# Patient Record
Sex: Female | Born: 1950 | Race: Black or African American | Hispanic: No | Marital: Married | Smoking: Never smoker
Health system: Southern US, Community
[De-identification: ages and names within clinical notes are randomized; demographics above are authoritative.]

## PROBLEM LIST (undated history)

## (undated) DIAGNOSIS — I10 Essential (primary) hypertension: Secondary | ICD-10-CM

## (undated) DIAGNOSIS — I4891 Unspecified atrial fibrillation: Secondary | ICD-10-CM

## (undated) HISTORY — DX: Unspecified atrial fibrillation: I48.91

## (undated) HISTORY — DX: Essential (primary) hypertension: I10

---

## 2009-12-04 ENCOUNTER — Encounter: Admission: RE | Admit: 2009-12-04 | Discharge: 2009-12-04 | Payer: Self-pay | Admitting: Orthopedic Surgery

## 2010-06-05 IMAGING — CR DG ANKLE 2V *L*
2 series · 2 of 2 positions shown · non-contrast
Comparison: None.

CLINICAL DATA: Ankle pain.

LEFT ANKLE - 2 VIEW

[view not recorded (1 of 2)]
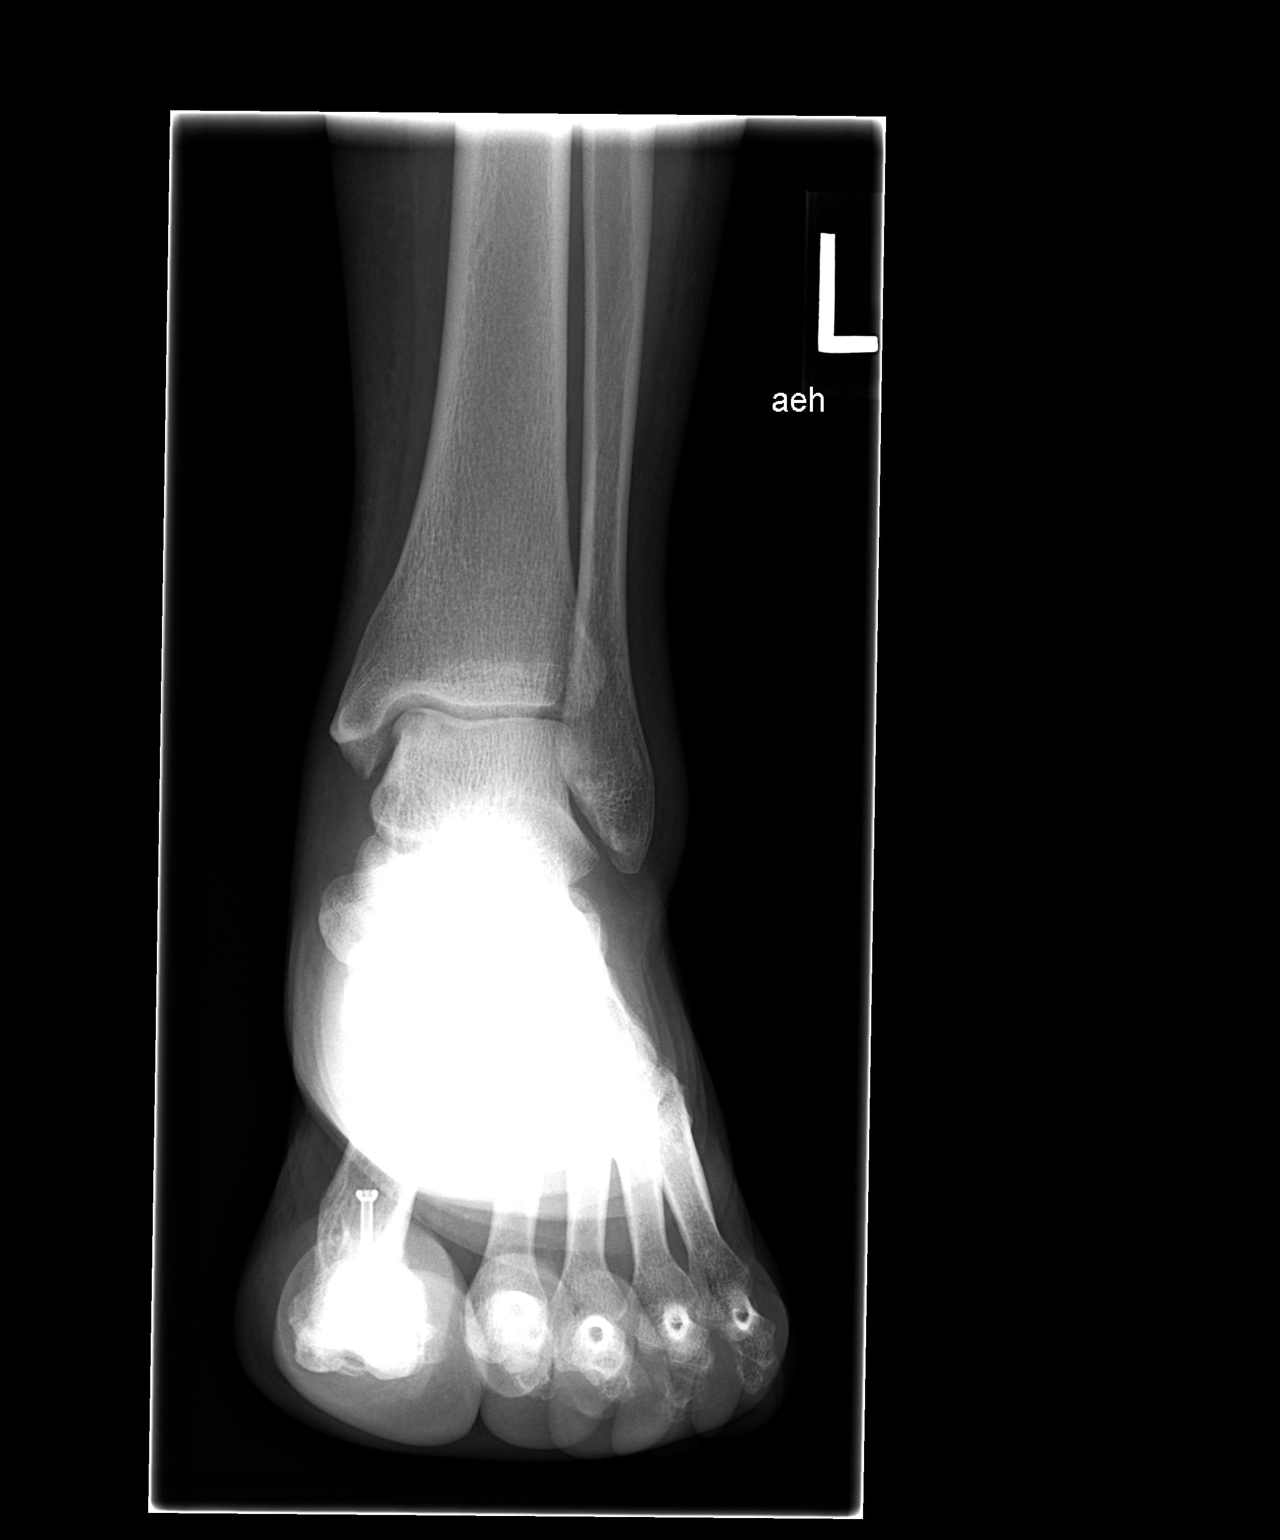

[view not recorded (2 of 2)]
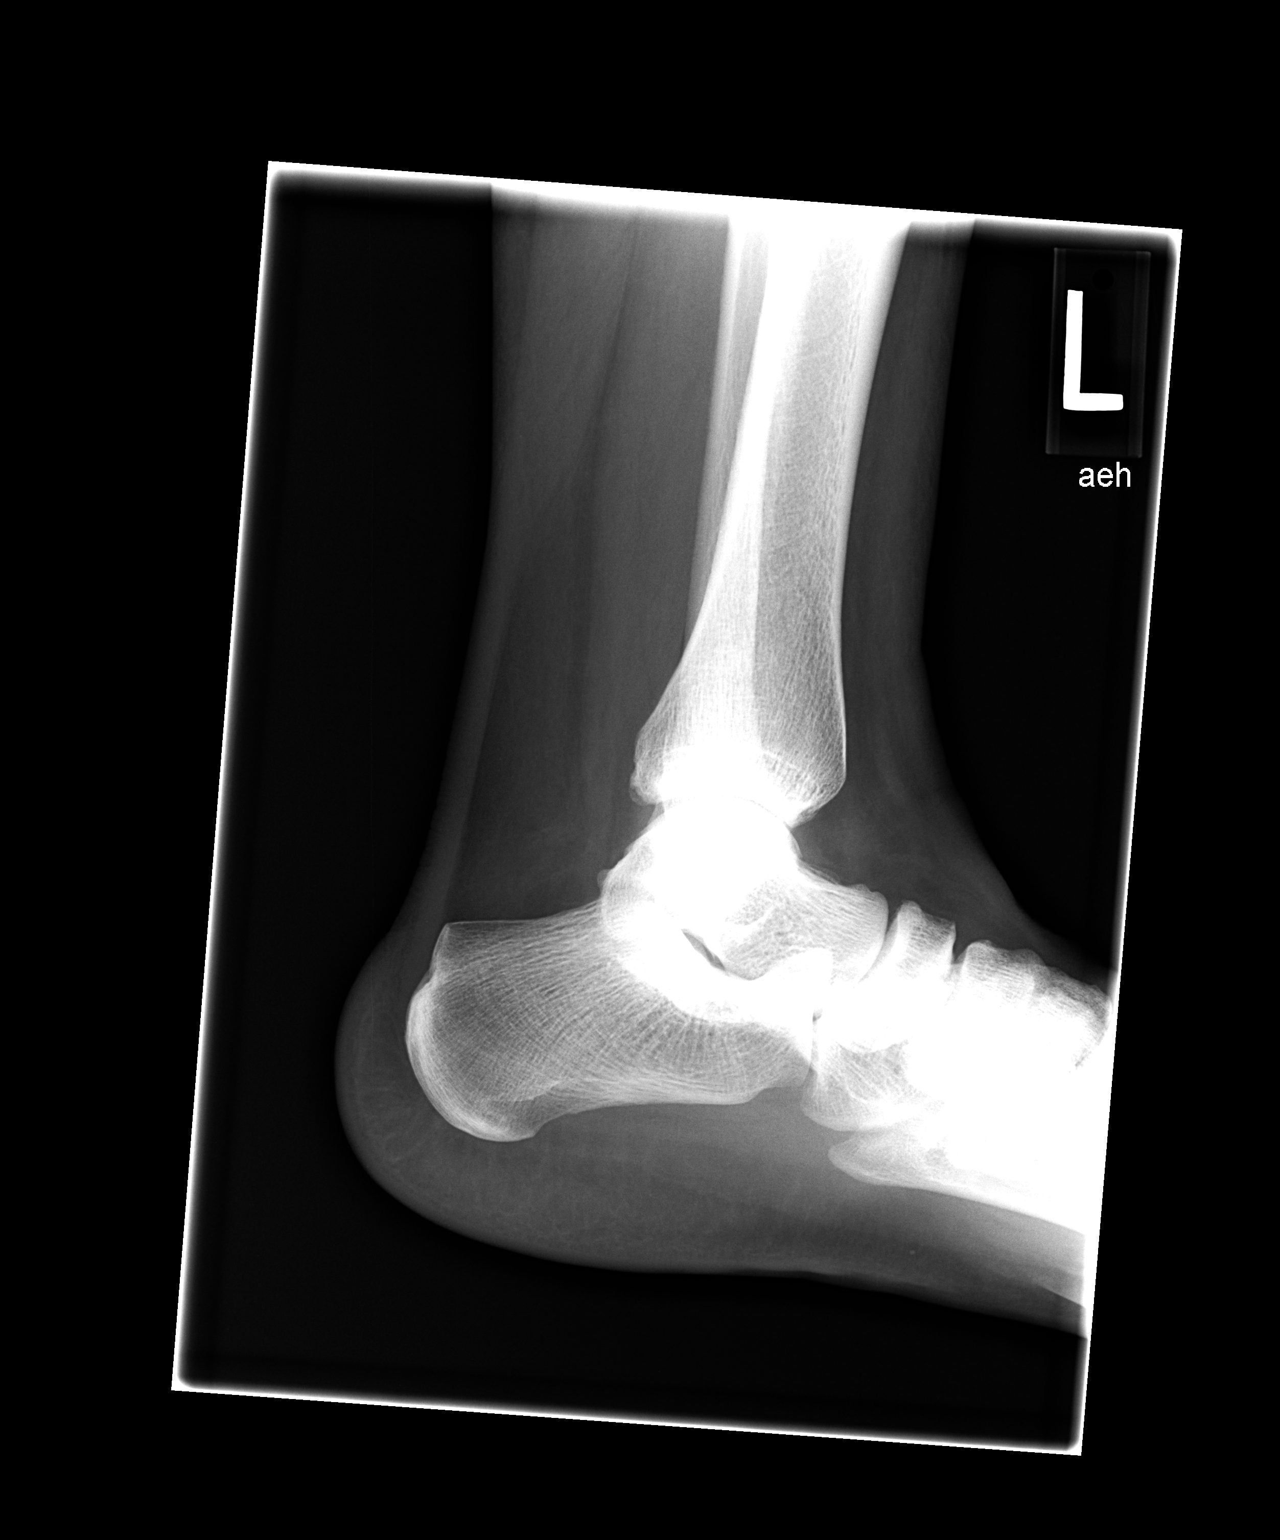

[2 of 2 positions shown; findings below may reference images not displayed]

FINDINGS: Mild soft tissue swelling is seen about the ankle joint.
No underlying fracture.
IMPRESSION: Mild soft tissue swelling without fracture.

## 2023-01-31 ENCOUNTER — Ambulatory Visit: Payer: Medicare (Managed Care) | Admitting: Dermatology

## 2023-01-31 ENCOUNTER — Encounter: Payer: Self-pay | Admitting: Dermatology

## 2023-01-31 VITALS — BP 131/84

## 2023-01-31 DIAGNOSIS — L668 Other cicatricial alopecia: Secondary | ICD-10-CM | POA: Diagnosis not present

## 2023-01-31 DIAGNOSIS — L669 Cicatricial alopecia, unspecified: Secondary | ICD-10-CM

## 2023-01-31 MED ORDER — SAFETY SEAL MISCELLANEOUS MISC: 1.0000 | Freq: Every day | 3 refills | Status: DC

## 2023-01-31 MED ORDER — TRIAMCINOLONE ACETONIDE 10 MG/ML IJ SUSP
2.0000 mg | Freq: Once | INTRAMUSCULAR | Status: AC
  Administered 2023-01-31: 2 mg via INTRADERMAL

## 2023-01-31 NOTE — Patient Instructions (Addendum)
Thank you for visiting my office today. 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  Here are the key treatment instructions and recommendations we discussed:  - Topical Treatment: Prescription for a compounded medication from [facility], containing clobetasol and minoxidil, to control inflammation and stimulate hair growth. Apply the drops in the morning to avoid unwanted hair growth on other areas of your skin.  - Kenalog Injections: We performed kenalog injections today  - Oral Supplements:   - Viviscal: Two tablets daily.   - Vital Proteins: As a collagen supplement to support hair strength and growth.  - Hair Care:   - Continue using gentle hair care products      - Hair dye is acceptable, preferably using natural ingredient brands like Aveda, and avoiding extreme bleaching.  - Lifestyle: Maintain a healthy scalp through regular exfoliation and protect your skin from excessive sun exposure.  - Follow-Up Appointment: Scheduled in four months to assess progress and adjust the treatment plan as needed. We will also review the effectiveness of the topical treatment and consider injections if you experience significant discomfort.  Please start the recommended treatments and supplements as prescribed, and do not hesitate to contact my office if you have any questions or concerns in the meantime.              Due to recent changes in healthcare laws, you may see results of your pathology and/or laboratory studies on MyChart before the doctors have had a chance to review them. We understand that in some cases there may be results that are confusing or concerning to you. Please understand that not all results are received at the same time and often the doctors may need to interpret multiple results in order to provide you with the best plan of care  or course of treatment. Therefore, we ask that you please give [place] 2 business days to thoroughly review all your results before contacting the office for clarification. Should we see a critical lab result, you will be contacted sooner.   If You Need Anything After Your Visit  If you have any questions or concerns for your doctor, please call our main line at [phone] If no one answers, please leave a voicemail as directed and we will return your call as soon as possible. Messages left after 4 pm will be answered the following business day.   You may also send [place] a message via MyChart. We typically respond to MyChart messages within 1-2 business days.  For prescription refills, please ask your pharmacy to contact our office. Our fax number is [phone].  If you have an urgent issue when the clinic is closed that cannot wait until the next business day, you can page your doctor at the number below.    Please note that while we do our best to be available for urgent issues outside of office hours, we are not available 24/7.   If you have an urgent issue and are unable to reach [place], you may choose to seek medical care at your doctor's office, retail clinic, urgent care center, or emergency room.  If you have a medical emergency, please immediately call 911 or go to the emergency department. In the event of inclement weather, please call our main line at [phone] for an update on the status of any delays or closures.  Dermatology Medication Tips: Please keep the  boxes that topical medications come in in order to help keep track of the instructions about where and how to use these. Pharmacies typically print the medication instructions only on the boxes and not directly on the medication tubes.   If your medication is too expensive, please contact our office at [phone] or send [place] a message through MyChart.   We are unable to tell what your co-pay for medications will be in advance as  this is different depending on your insurance coverage. However, we may be able to find a substitute medication at lower cost or fill out paperwork to get insurance to cover a needed medication.   If a prior authorization is required to get your medication covered by your insurance company, please allow [place] 1-2 business days to complete this process.  Drug prices often vary depending on where the prescription is filled and some pharmacies may offer cheaper prices.  The website www.goodrx.com contains coupons for medications through different pharmacies. The prices here do not account for what the cost may be with help from insurance (it may be cheaper with your insurance), but the website can give you the price if you did not use any insurance.  - You can print the associated coupon and take it with your prescription to the pharmacy.  - You may also stop by our office during regular business hours and pick up a GoodRx coupon card.  - If you need your prescription sent electronically to a different pharmacy, notify our office through [facility] or by phone at [phone]

## 2023-01-31 NOTE — Progress Notes (Signed)
   New Patient Visit   Subjective  Shelby Lawson is a 72 y.o. female who presents for the following: Hair loss on the scalp x 20 years. She is concerned of wide parts and general thinning. It has been natural for 25 years. Her father has a history of alopecia at 32 y/o. She has tried Rogaine for Women x 3 months. She is not sure if she used it long enough. She tried ILK injections by a Dermatologist in [place] 20 years ago which helped.She occasionally gets itching and flaking. Current treatment with Bask and Lather scalp Stimulator, shampoo and conditioner x 2 months. No burning or tenderness. Prior history of chemical relaxers, heating appliances, and permanents.    The following portions of the chart were reviewed this encounter and updated as appropriate: medications, allergies, medical history  Review of Systems:  No other skin or systemic complaints except as noted in HPI or Assessment and Plan.  Objective  Well appearing patient in no apparent distress; mood and affect are within normal limits.              A focused examination was performed of the following areas: Scalp  Relevant exam findings are noted in the Assessment and Plan.  vertex scalp Areas of hair loss with loss of follicular ostia.     Assessment & Plan   CCCA Exam: thinning and hair loss at central and frontal scalp  Central Centrifugal Cicatricial Alopecia (CCCA) is a chronic/progressive and irreversible patterned form of scarring alopecia that most commonly affects middle-aged women of African descent that presents with progressive hair loss, starting as a single patch at the vertex of the scalp and then expanding in a centrifugal and symmetrical pattern on the crown. Triggering or aggravation of the disease may occur following traumatic hair care practices, such as cornrows and braiding, extensions, weaves with sewn-in or glued-on hair, use of hot combs, and frequent use of hair relaxers. These  practices should be discontinued to slow progression of disease. Therapies may stop or slow progression but generally do not lead to hair regrowth in scarred areas.   Treatment Plan: Discussed using Rogaine, ILK injections, and compounded topicals. compounded AA gel every morning Viviscal supplements daily Vital proteins supplement  Procedure Note: Cicatricial alopecia vertex scalp  Intralesional injection - vertex scalp Procedure Note Intralesional Injection  Location: vertex of scalp  Informed Consent: Discussed risks (infection, pain, bleeding, bruising, thinning of the skin, loss of skin pigment, lack of resolution, and recurrence of lesion) and benefits of the procedure, as well as the alternatives. Informed consent was obtained. Preparation: The area was prepared a standard fashion.  Anesthesia:none  Procedure Details: An intralesional injection was performed with Kenalog 2 mg/cc. 2.5 cc in total were injected. NDC #: 1610-9604-54 Exp: 06/2024  Total number of injections: > 10  Plan: The patient was instructed on post-op care. Recommend OTC analgesia as needed for pain.   Safety Seal Miscellaneous MISC - vertex scalp 1 Application by Does not apply route daily. Medication: AA gel  Related Medications triamcinolone acetonide (KENALOG) 10 MG/ML injection 2 mg     Return in about 4 months (around 06/03/2023) for Alopecia Follow Up.  Jaclynn Guarneri, CMA, am acting as scribe for [employer], DO.   Documentation: I have reviewed the above documentation for accuracy and completeness, and I agree with the above.  Langston Reusing, DO

## 2023-05-24 ENCOUNTER — Other Ambulatory Visit: Payer: Self-pay

## 2023-05-24 DIAGNOSIS — L669 Cicatricial alopecia, unspecified: Secondary | ICD-10-CM

## 2023-05-24 MED ORDER — SAFETY SEAL MISCELLANEOUS MISC: 1.0000 | Freq: Every day | 4 refills | Status: DC

## 2023-06-06 ENCOUNTER — Ambulatory Visit: Payer: Medicare (Managed Care) | Admitting: Dermatology

## 2023-06-06 ENCOUNTER — Encounter: Payer: Self-pay | Admitting: Dermatology

## 2023-06-06 DIAGNOSIS — L6681 Central centrifugal cicatricial alopecia: Secondary | ICD-10-CM

## 2023-06-06 DIAGNOSIS — L669 Cicatricial alopecia, unspecified: Secondary | ICD-10-CM

## 2023-06-06 DIAGNOSIS — L988 Other specified disorders of the skin and subcutaneous tissue: Secondary | ICD-10-CM

## 2023-06-06 MED ORDER — TRIAMCINOLONE ACETONIDE 10 MG/ML IJ SUSP: 5.0000 mg | Freq: Once | INTRAMUSCULAR | Status: AC

## 2023-06-06 NOTE — Patient Instructions (Addendum)
Hello Shelby Lawson,  Thank you for visiting [place] today. We are encouraged by your dedication to improving your health and are pleased with the progress you are making. Here is a summary of the key instructions from today's consultation:  - A.A. Gel: Continue using the A.A. gel containing clobetasol and minoxidil as discussed. If preferred, we can prescribe them separately as liquids.  - Injections: We performed kenalog injections today to aid in your treatment.  - Skincare Regimen: Start using La Roche-Posay products for your face, including washes and moisturizers. Samples have been provided to you.  - Retinol Cream: Begin an over-the-counter retinol cream regimen using ROC Retinol every other night. We will consider prescription retinol at your follow-up.  - Caffeine Serum: Explore using The Ordinary's caffeine serum for puffiness, available at [facility] or online.  - Scalp Monitoring: Continue monitoring your scalp for itching and inflammation, and use clobetasol as needed.  - Hair Dyes: Consider using hair dyes from brands like Aveda that do not use PPD to minimize chemical exposure.  - Follow-Up: We will see you back in three months to review your progress and adjust your treatment plan as necessary.  Please follow these instructions carefully and do not hesitate to contact us if you have any questions or concerns.  Warm regards,  Dr. Langston Reusing Dermatology        Important Information  Due to recent changes in healthcare laws, you may see results of your pathology and/or laboratory studies on MyChart before the doctors have had a chance to review them. We understand that in some cases there may be results that are confusing or concerning to you. Please understand that not all results are received at the same time and often the doctors may need to interpret multiple results in order to provide you with the best plan of care or course of treatment. Therefore, we ask that you please  give [place] 2 business days to thoroughly review all your results before contacting the office for clarification. Should we see a critical lab result, you will be contacted sooner.   If You Need Anything After Your Visit  If you have any questions or concerns for your doctor, please call our main line at [phone] If no one answers, please leave a voicemail as directed and we will return your call as soon as possible. Messages left after 4 pm will be answered the following business day.   You may also send [place] a message via MyChart. We typically respond to MyChart messages within 1-2 business days.  For prescription refills, please ask your pharmacy to contact our office. Our fax number is [phone].  If you have an urgent issue when the clinic is closed that cannot wait until the next business day, you can page your doctor at the number below.    Please note that while we do our best to be available for urgent issues outside of office hours, we are not available 24/7.   If you have an urgent issue and are unable to reach [place], you may choose to seek medical care at your doctor's office, retail clinic, urgent care center, or emergency room.  If you have a medical emergency, please immediately call 911 or go to the emergency department. In the event of inclement weather, please call our main line at [phone] for an update on the status of any delays or closures.  Dermatology Medication Tips: Please keep the boxes that topical medications come in in order to help keep  track of the instructions about where and how to use these. Pharmacies typically print the medication instructions only on the boxes and not directly on the medication tubes.   If your medication is too expensive, please contact our office at [phone] or send [place] a message through MyChart.   We are unable to tell what your co-pay for medications will be in advance as this is different depending on your insurance coverage.  However, we may be able to find a substitute medication at lower cost or fill out paperwork to get insurance to cover a needed medication.   If a prior authorization is required to get your medication covered by your insurance company, please allow [place] 1-2 business days to complete this process.  Drug prices often vary depending on where the prescription is filled and some pharmacies may offer cheaper prices.  The website www.goodrx.com contains coupons for medications through different pharmacies. The prices here do not account for what the cost may be with help from insurance (it may be cheaper with your insurance), but the website can give you the price if you did not use any insurance.  - You can print the associated coupon and take it with your prescription to the pharmacy.  - You may also stop by our office during regular business hours and pick up a GoodRx coupon card.  - If you need your prescription sent electronically to a different pharmacy, notify our office through [facility] or by phone at [phone]

## 2023-06-06 NOTE — Progress Notes (Unsigned)
   Follow-Up Visit   Subjective  Shelby Lawson is a 72 y.o. female who presents for the following: CCCA  Patient present today for follow up visit for follow up. Patient was last evaluated on 01/31/23. Pt has been using AA Gel/ Patient reports sxs are better. She has noticed some hair growth and thickness.  Patient denies medication changes.  The following portions of the chart were reviewed this encounter and updated as appropriate: medications, allergies, medical history  Review of Systems:  No other skin or systemic complaints except as noted in HPI or Assessment and Plan.  Objective  Well appearing patient in no apparent distress; mood and affect are within normal limits.   A focused examination was performed of the following areas: hair and scalp   Relevant exam findings are noted in the Assessment and Plan.   vertex scalp Areas of hair loss with loss of follicular ostia.              Assessment & Plan   CCCA Exam: thinning and hair loss at central and frontal scalp with good amount of regrowth  Treatment Plan: - Continue using the AA Gel on affected areas once daily. - Suggested tying Aveda hair color due to it's natural nature. -ontinue current treatment and monitor progress. Re-evaluate in 3 months. Educate patient on potential triggers and advise against the use of chemical relaxers.   ANTI-AGING Exam:  dynamic and static rhytides, puffiness around the eyes  Treatment Plan: - Samples of La Roche Posay  - Recommended trying Roc Retinol & The Ordinary Caffeine Solution (OTC). Pictures provided in AVS.   Cicatricial alopecia vertex scalp  triamcinolone acetonide (KENALOG) 10 MG/ML injection 5 mg - vertex scalp   Related Medications Safety Seal Miscellaneous MISC 1 Application by Does not apply route daily. Medication: AA gel    Return for CCCA.    Documentation: I have reviewed the above documentation for accuracy and completeness, and I agree with  the above.   I, Shirron Marcha Solders, CMA, am acting as scribe for [employer], DO.   Langston Reusing, DO

## 2023-07-18 NOTE — Addendum Note (Signed)
Addended by: Terri Piedra on: 07/18/2023 08:19 AM   Modules accepted: Orders

## 2023-09-05 ENCOUNTER — Encounter: Payer: Self-pay | Admitting: Dermatology

## 2023-09-05 ENCOUNTER — Ambulatory Visit (INDEPENDENT_AMBULATORY_CARE_PROVIDER_SITE_OTHER): Payer: Medicare Other | Admitting: Dermatology

## 2023-09-05 DIAGNOSIS — L680 Hirsutism: Secondary | ICD-10-CM

## 2023-09-05 DIAGNOSIS — L811 Chloasma: Secondary | ICD-10-CM | POA: Diagnosis not present

## 2023-09-05 DIAGNOSIS — L669 Cicatricial alopecia, unspecified: Secondary | ICD-10-CM

## 2023-09-05 DIAGNOSIS — L6681 Central centrifugal cicatricial alopecia: Secondary | ICD-10-CM

## 2023-09-05 MED ORDER — TRIAMCINOLONE ACETONIDE 10 MG/ML IJ SUSP
5.0000 mg | Freq: Once | INTRAMUSCULAR | Status: AC
  Administered 2023-09-05: 5 mg via INTRADERMAL

## 2023-09-05 MED ORDER — SAFETY SEAL MISCELLANEOUS MISC: 2 refills | Status: DC

## 2023-09-05 NOTE — Patient Instructions (Addendum)
 Hello Shelby Lawson,  Thank you for visiting my office today. Your dedication to improving your health is commendable, and it's a pleasure to see the progress in your scalp condition. Here is a summary of the key instructions from today's consultation:  Topical Treatment for Scalp: Continue applying the topical treatment every morning.  Hair Growth Injections: We will administer five more injections to aid in hair growth.  Discontinue La Roche-Posay Mela B3: Due to skin sensitivity, please stop using this product.  New Prescription Cream: A prescription for a lightening cream containing tranexamic acid, vitamin C, and niacinamide has been sent to [facility].   Application: Apply this cream morning and night, and follow up with sunscreen in the morning.  Prevent Treatment Transfer: To prevent topical treatment transfer to your pillow, tie your hair up at night.  Martina Sledge Whiskers Treatment: For gray whiskers, consider additional sessions of electrolysis if previous attempts were not effective.  Melasma Topical: Start using the prescribed topical for melasma, applying it morning and night. Ensure to wear sunscreen daily, even when driving.  Sunscreen Recommendation: I have recommended a specific sunscreen that is effective for brown skin, which you can find on [employer].  Please ensure to apply all topical treatments as directed and protect your skin with sunscreen. We will see you in six months for a follow-up, or sooner if you have any concerns or run out of your prescriptions.  Best regards,  Dr. Louana Roup Dermat[facility]     Recommended Sunscreen

## 2023-09-05 NOTE — Progress Notes (Signed)
   Follow-Up Visit   Subjective  Shelby Lawson is a 73 y.o. female who presents for the following: CCCA  Patient presents today for follow up visit for follow up. Patient was last evaluated on 06/06/2023. Pt has been using AA Gel/ Patient reports sxs are better. She has noticed some hair growth and thickness.  Patient denies medication changes.  The following portions of the chart were reviewed this encounter and updated as appropriate: medications, allergies, medical history  Review of Systems:  No other skin or systemic complaints except as noted in HPI or Assessment and Plan.  Objective  Well appearing patient in no apparent distress; mood and affect are within normal limits.   A focused examination was performed of the following areas: hair and scalp   Relevant exam findings are noted in the Assessment and Plan.                  Assessment & Plan   CCCA Exam: thinning and hair loss at central and frontal scalp with good amount of regrowth  Assessment: Improvement in the patient's scalp, with regrowth in the front part and no spread of hair loss to the peripheries. Some scarred follicles remain on the vertex, and the middle area still presents challenges. The patient has been compliant with the prescribed topical treatment since July.  Procedure Note Intralesional Injection  Location: scalp  Informed Consent: Discussed risks (infection, pain, bleeding, bruising, thinning of the skin, loss of skin pigment, lack of resolution, and recurrence of lesion) and benefits of the procedure, as well as the alternatives. Informed consent was obtained. Preparation: The area was prepared a standard fashion.  Anesthesia: none  Procedure Details: An intralesional injection was performed with Kenalog 5 mg/cc. 1 cc in total were injected. NDC #: 9528-4132-44 Exp: 09/2024  Total number of injections: 15  Plan: The patient was instructed on post-op care. Recommend OTC  analgesia as needed for pain.     Melasma Assessment: Patient experienced skin irritation with La Roche-Posay Mela B3 cream.  Plan: Discontinue La Roche-Posay Mela B3 cream. Prescribe a new compounded lightening cream containing tranexamic acid, vitamin C, and niacinamide. Instruct the patient to apply the new prescription cream morning and night.   Advise the use of sunscreen daily, including when driving. Recommend a specific [place] sunscreen brand, with details provided in the after-visit summary.  Facial Hirsutism Assessment: Patient reports hair growth on the face, possibly due to transfer of scalp topical medication. Previous attempts at electrolysis were unsuccessful.  Plan: Instruct the patient to tie hair up at night to prevent topical medication transfer. Consider additional electrolysis sessions for gray whiskers, if desired.  CICATRICIAL ALOPECIA   Related Medications Safety Seal Miscellaneous MISC 1 Application by Does not apply route daily. Medication: AA gel triamcinolone acetonide (KENALOG) 10 MG/ML injection 5 mg  triamcinolone acetonide (KENALOG) 10 MG/ML injection 5 mg    Return in about 6 months (around 03/04/2024) for CCCA, Melasma follow up.    Documentation: I have reviewed the above documentation for accuracy and completeness, and I agree with the above.   ILawson Radar, CMA, am acting as scribe for Langston Reusing, DO.   Langston Reusing, DO

## 2023-11-08 ENCOUNTER — Other Ambulatory Visit: Payer: Self-pay

## 2023-11-08 DIAGNOSIS — L669 Cicatricial alopecia, unspecified: Secondary | ICD-10-CM

## 2023-11-08 MED ORDER — SAFETY SEAL MISCELLANEOUS MISC
1.0000 | Freq: Every day | 4 refills | Status: DC
Start: 2023-11-08 — End: 2024-04-05

## 2023-11-08 NOTE — Progress Notes (Signed)
refill 

## 2023-12-05 ENCOUNTER — Other Ambulatory Visit: Payer: Self-pay

## 2023-12-05 MED ORDER — SAFETY SEAL MISCELLANEOUS MISC: 3 refills | Status: DC

## 2024-03-06 ENCOUNTER — Ambulatory Visit: Payer: Medicare Other | Admitting: Dermatology

## 2024-03-06 ENCOUNTER — Encounter: Payer: Self-pay | Admitting: Dermatology

## 2024-03-06 VITALS — BP 111/69 | HR 52

## 2024-03-06 DIAGNOSIS — L649 Androgenic alopecia, unspecified: Secondary | ICD-10-CM | POA: Diagnosis not present

## 2024-03-06 DIAGNOSIS — L6681 Central centrifugal cicatricial alopecia: Secondary | ICD-10-CM

## 2024-03-06 DIAGNOSIS — L811 Chloasma: Secondary | ICD-10-CM

## 2024-03-06 DIAGNOSIS — L669 Cicatricial alopecia, unspecified: Secondary | ICD-10-CM

## 2024-03-06 NOTE — Patient Instructions (Addendum)
 Date: Tue Mar 06 2024  Hello Glendy,  Thank you for visiting today. Here is a summary of the key instructions:  - Medications:   - Apply gel to scalp every morning to treat hair loss   - Use Eucerin Radiant Tone serum in the morning and evening   - Apply MedRock lightening cream (with tranexamic acid) in the morning and evening   - Use Eucerin sunscreen in the morning  - Skin Care:   - Wash face morning and evening   - Wear wide-brimmed hats   - Reapply sunscreen every 3 hours in direct sun   - Use thick sunscreen for beach days   - Apply sunscreen even when driving  - Other Instructions:   - Continue current topical hair loss treatment to prevent scarring from returning   - Be careful of sun reflection from water, which can darken melasma   - Message through MyChart for any questions  Please reach out if you have any questions or concerns.  Warm regards,  Dr. [facility]    Important Information   Due to recent changes in healthcare laws, you may see results of your pathology and/or laboratory studies on MyChart before the doctors have had a chance to review them. We understand that in some cases there may be results that are confusing or concerning to you. Please understand that not all results are received at the same time and often the doctors may need to interpret multiple results in order to provide you with the best plan of care or course of treatment. Therefore, we ask that you please give us  2 business days to thoroughly review all your results before contacting the office for clarification. Should we see a critical lab result, you will be contacted sooner.     If You Need Anything After Your Visit   If you have any questions or concerns for your doctor, please call our main line at [phone]. If no one answers, please leave a voicemail as directed and we will return your call as soon as possible. Messages left after 4 pm will be answered the  following business day.    You may also send us  a message via MyChart. We typically respond to MyChart messages within 1-2 business days.  For prescription refills, please ask your pharmacy to contact our office. Our fax number is [phone].  If you have an urgent issue when the clinic is closed that cannot wait until the next business day, you can page your doctor at the number below.     Please note that while we do our best to be available for urgent issues outside of office hours, we are not available 24/7.    If you have an urgent issue and are unable to reach us , you may choose to seek medical care at your doctor's office, retail clinic, urgent care center, or emergency room.   If you have a medical emergency, please immediately call 911 or go to the emergency department. In the event of inclement weather, please call our main line at [phone] for an update on the status of any delays or closures.  Dermatology Medication Tips: Please keep the boxes that topical medications come in in order to help keep track of the instructions about where and how to use these. Pharmacies typically print the medication instructions only on the boxes and not directly on the medication tubes.   If your medication is too expensive, please contact our office at [phone]  or send us  a message through MyChart.    We are unable to tell what your co-pay for medications will be in advance as this is different depending on your insurance coverage. However, we may be able to find a substitute medication at lower cost or fill out paperwork to get insurance to cover a needed medication.    If a prior authorization is required to get your medication covered by your insurance company, please allow us  1-2 business days to complete this process.   Drug prices often vary depending on where the prescription is filled and some pharmacies may offer cheaper prices.   The website www.goodrx.com contains coupons  for medications through different pharmacies. The prices here do not account for what the cost may be with help from insurance (it may be cheaper with your insurance), but the website can give you the price if you did not use any insurance.  - You can print the associated coupon and take it with your prescription to the pharmacy.  - You may also stop by our office during regular business hours and pick up a GoodRx coupon card.  - If you need your prescription sent electronically to a different pharmacy, notify our office through [facility] or by phone at [phone]

## 2024-03-06 NOTE — Progress Notes (Signed)
   Follow-Up Visit   Subjective  Shelby Lawson is a 73 y.o. female who presents for the following: CCCA and Melasma  Patient present today for follow up visit for CCCA and Melasma. Patient was last evaluated on 09/05/23. At this visit, For CCCA, patient was prescribed AA Gel and had ILK Ionjections completed. Patient reports sxs are improving. Patient denies medication changes.  For Melasma, patient was prescribed Melaxemic Cream that she applies 2 times daily. Patient reports she feels her melasma is improving. She states she is not currently applying sunscreen.   The following portions of the chart were reviewed this encounter and updated as appropriate: medications, allergies, medical history  Review of Systems:  No other skin or systemic complaints except as noted in HPI or Assessment and Plan.  Objective  Well appearing patient in no apparent distress; mood and affect are within normal limits.  A focused examination was performed of the following areas: Scalp and Face  Relevant exam findings are noted in the Assessment and Plan.                        Assessment & Plan   1. CCCA / Androgenetic Alopecia - Assessment: Patient demonstrates significant regrowth on the vertex and frontal scalp, with mild clinical scarring noted. The middle scalp area shows slightly more hair loss. Patient is currently using a topical gel treatment every morning, which is effectively managing the condition and promoting hair regrowth.  - Plan:    Continue current topical gel treatment every morning    Educate patient on the chronic nature of the condition and the importance of continued treatment    Obtain follow-up photographs for comparison    Reassess at next follow-up appointment  2. Melasma - Assessment: Patient's skin pigmentation shows improvement, but further enhancement is possible. Currently using a compound from [facility] containing tranexamic acid. Recommended additional products  containing thiametol for targeted pigment treatment.  - Plan:    Continue MedRock compound with tranexamic acid    Add Eucerin Radiant Tone serum and Eucerin sunscreen containing thiametol to regimen    Instruct on daily skincare routine: Morning wash face, apply Eucerin Radiant Tone, Medrol cream, and Eucerin sunscreen; Evening wash face, apply Eucerin Radiant Tone and MedRock cream    Educate on sun protection including daily sunscreen use, reapplication every 3 hours in direct sun, wide-brimmed hats, and heavy-duty sunscreen for beach activities    Follow up to assess response to new regimen    Return in about 6 months (around 09/06/2024) for Melasma & CCCA F/U.  I, Jetta Ager, am acting as Neurosurgeon for [employer], DO.  Documentation: I have reviewed the above documentation for accuracy and completeness, and I agree with the above.  Delon Lenis, DO

## 2024-04-05 ENCOUNTER — Encounter: Payer: Self-pay | Admitting: Dermatology

## 2024-04-05 ENCOUNTER — Other Ambulatory Visit: Payer: Self-pay

## 2024-04-05 DIAGNOSIS — L669 Cicatricial alopecia, unspecified: Secondary | ICD-10-CM

## 2024-04-05 MED ORDER — SAFETY SEAL MISCELLANEOUS MISC: 1.0000 | Freq: Every day | 9 refills | Status: AC

## 2024-04-05 MED ORDER — SAFETY SEAL MISCELLANEOUS MISC: 3 refills | Status: DC

## 2024-07-27 ENCOUNTER — Encounter: Payer: Self-pay | Admitting: Dermatology

## 2024-07-30 NOTE — Telephone Encounter (Signed)
 Hi Brooke,  Can you please send refill for the above medication.  Thanks!

## 2024-07-31 ENCOUNTER — Other Ambulatory Visit: Payer: Self-pay | Admitting: Dermatology

## 2024-07-31 DIAGNOSIS — L811 Chloasma: Secondary | ICD-10-CM

## 2024-07-31 MED ORDER — SAFETY SEAL MISCELLANEOUS MISC: 5 refills | Status: AC

## 2024-08-29 ENCOUNTER — Telehealth: Payer: Self-pay

## 2024-08-29 NOTE — Telephone Encounter (Signed)
 Patient called into office to inquire of we are currently offering Botox Injections. I advised our office is not offering Botox injections as of yet. She voiced understanding.

## 2024-09-10 ENCOUNTER — Ambulatory Visit: Admitting: Dermatology
